# Patient Record
Sex: Male | Born: 1990 | Race: White | Hispanic: No | Marital: Single | Smoking: Former smoker
Health system: Southern US, Community
[De-identification: ages and names within clinical notes are randomized; demographics above are authoritative.]

## PROBLEM LIST (undated history)

## (undated) DIAGNOSIS — S92919A Unspecified fracture of unspecified toe(s), initial encounter for closed fracture: Secondary | ICD-10-CM

## (undated) DIAGNOSIS — S2239XA Fracture of one rib, unspecified side, initial encounter for closed fracture: Secondary | ICD-10-CM

## (undated) DIAGNOSIS — S27309A Unspecified injury of lung, unspecified, initial encounter: Secondary | ICD-10-CM

## (undated) HISTORY — DX: Unspecified injury of lung, unspecified, initial encounter: S27.309A

## (undated) HISTORY — DX: Unspecified fracture of unspecified toe(s), initial encounter for closed fracture: S92.919A

## (undated) HISTORY — DX: Fracture of one rib, unspecified side, initial encounter for closed fracture: S22.39XA

## (undated) HISTORY — PX: SHOULDER ARTHROSCOPY: SHX128

## (undated) HISTORY — PX: HAND SURGERY: SHX662

---
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
## 2010-05-18 ENCOUNTER — Inpatient Hospital Stay (HOSPITAL_COMMUNITY)
Admission: EM | Admit: 2010-05-18 | Discharge: 2010-05-20 | DRG: 200 | Disposition: A | Discharge status: Home / Self Care | Payer: No Typology Code available for payment source | Attending: General Surgery | Admitting: General Surgery

## 2010-05-18 ENCOUNTER — Emergency Department (HOSPITAL_COMMUNITY): Payer: No Typology Code available for payment source

## 2010-05-18 ENCOUNTER — Encounter (HOSPITAL_COMMUNITY): Payer: Self-pay | Admitting: Radiology

## 2010-05-18 DIAGNOSIS — F17200 Nicotine dependence, unspecified, uncomplicated: Secondary | ICD-10-CM | POA: Diagnosis present

## 2010-05-18 DIAGNOSIS — S060X0A Concussion without loss of consciousness, initial encounter: Secondary | ICD-10-CM | POA: Diagnosis present

## 2010-05-18 DIAGNOSIS — S2239XA Fracture of one rib, unspecified side, initial encounter for closed fracture: Secondary | ICD-10-CM | POA: Diagnosis present

## 2010-05-18 DIAGNOSIS — M549 Dorsalgia, unspecified: Secondary | ICD-10-CM | POA: Diagnosis present

## 2010-05-18 DIAGNOSIS — J9383 Other pneumothorax: Principal | ICD-10-CM | POA: Diagnosis present

## 2010-05-18 DIAGNOSIS — S27329A Contusion of lung, unspecified, initial encounter: Secondary | ICD-10-CM | POA: Diagnosis present

## 2010-05-18 DIAGNOSIS — T148XXA Other injury of unspecified body region, initial encounter: Secondary | ICD-10-CM | POA: Diagnosis present

## 2010-05-18 DIAGNOSIS — Y998 Other external cause status: Secondary | ICD-10-CM

## 2010-05-18 DIAGNOSIS — S93336A Other dislocation of unspecified foot, initial encounter: Secondary | ICD-10-CM | POA: Diagnosis present

## 2010-05-18 LAB — DIFFERENTIAL
Basophils Absolute: 0 10*3/uL (ref 0.0–0.1)
Eosinophils Relative: 1 % (ref 0–5)
Lymphocytes Relative: 18 % (ref 12–46)
Lymphs Abs: 2.8 10*3/uL (ref 0.7–4.0)
Neutro Abs: 11.9 10*3/uL — ABNORMAL HIGH (ref 1.7–7.7)
Neutrophils Relative %: 75 % (ref 43–77)

## 2010-05-18 LAB — BASIC METABOLIC PANEL
Calcium: 9 mg/dL (ref 8.4–10.5)
Chloride: 105 meq/L (ref 96–112)
Creatinine, Ser: 1.13 mg/dL (ref 0.4–1.5)
GFR calc Af Amer: >60 mL/min (ref >60)
Sodium: 137 meq/L (ref 135–145)

## 2010-05-18 LAB — CBC
HCT: 46.7 % (ref 39.0–52.0)
MCV: 90.3 fL (ref 78.0–100.0)
RBC: 5.17 MIL/uL (ref 4.22–5.81)
RDW: 12.3 % (ref 11.5–15.5)
WBC: 15.8 10*3/uL — ABNORMAL HIGH (ref 4.0–10.5)

## 2010-05-18 LAB — URINALYSIS, ROUTINE W REFLEX MICROSCOPIC
Protein, ur: NEGATIVE mg/dL
Specific Gravity, Urine: 1.035 — ABNORMAL HIGH (ref 1.005–1.030)
Urobilinogen, UA: 0.2 mg/dL (ref 0.0–1.0)

## 2010-05-18 LAB — ETHANOL: Alcohol, Ethyl (B): <5 mg/dL (ref 0–10)

## 2010-05-18 MED ORDER — IOHEXOL 300 MG/ML  SOLN
80.0000 mL | Freq: Once | INTRAMUSCULAR | Status: AC | PRN
  Administered 2010-05-18: 80 mL via INTRAVENOUS

## 2010-05-19 ENCOUNTER — Inpatient Hospital Stay (HOSPITAL_COMMUNITY): Payer: No Typology Code available for payment source

## 2010-05-19 LAB — BASIC METABOLIC PANEL
CO2: 27 mEq/L (ref 19–32)
Calcium: 8.3 mg/dL — ABNORMAL LOW (ref 8.4–10.5)
Calcium: 8.6 mg/dL (ref 8.4–10.5)
Chloride: 108 mEq/L (ref 96–112)
GFR calc Af Amer: >60 mL/min (ref >60)
GFR calc Af Amer: >60 mL/min (ref >60)
GFR calc non Af Amer: >60 mL/min (ref >60)
Potassium: 4.5 mEq/L (ref 3.5–5.1)
Sodium: 139 mEq/L (ref 135–145)
Sodium: 140 mEq/L (ref 135–145)

## 2010-05-19 LAB — CBC
Hemoglobin: 13.6 g/dL (ref 13.0–17.0)
MCHC: 34.3 g/dL (ref 30.0–36.0)
MCHC: 33.7 g/dL (ref 30.0–36.0)
Platelets: 155 10*3/uL (ref 150–400)
RBC: 4.34 MIL/uL (ref 4.22–5.81)
RDW: 12.5 % (ref 11.5–15.5)
WBC: 13.4 10*3/uL — ABNORMAL HIGH (ref 4.0–10.5)
WBC: 8.6 10*3/uL (ref 4.0–10.5)

## 2010-05-19 LAB — MRSA PCR SCREENING: MRSA by PCR: NEGATIVE

## 2010-05-20 ENCOUNTER — Inpatient Hospital Stay (HOSPITAL_COMMUNITY): Payer: No Typology Code available for payment source

## 2010-05-20 LAB — CBC
HCT: 38.7 % — ABNORMAL LOW (ref 39.0–52.0)
Hemoglobin: 12.9 g/dL — ABNORMAL LOW (ref 13.0–17.0)
MCH: 31 pg (ref 26.0–34.0)
RBC: 4.16 MIL/uL — ABNORMAL LOW (ref 4.22–5.81)
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
## 2010-05-25 ENCOUNTER — Other Ambulatory Visit: Payer: Self-pay | Admitting: General Surgery

## 2010-05-25 ENCOUNTER — Ambulatory Visit (HOSPITAL_COMMUNITY)
Admission: RE | Admit: 2010-05-25 | Discharge: 2010-05-25 | Disposition: A | Discharge status: Home / Self Care | Payer: No Typology Code available for payment source | Source: Ambulatory Visit | Attending: General Surgery | Admitting: General Surgery

## 2010-05-25 DIAGNOSIS — S270XXA Traumatic pneumothorax, initial encounter: Secondary | ICD-10-CM | POA: Insufficient documentation

## 2010-05-25 DIAGNOSIS — J939 Pneumothorax, unspecified: Secondary | ICD-10-CM

## 2010-05-25 DIAGNOSIS — R0789 Other chest pain: Secondary | ICD-10-CM | POA: Insufficient documentation
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
## 2010-07-25 ENCOUNTER — Encounter (INDEPENDENT_AMBULATORY_CARE_PROVIDER_SITE_OTHER): Payer: Self-pay | Admitting: Surgery

## 2013-03-07 ENCOUNTER — Emergency Department (INDEPENDENT_AMBULATORY_CARE_PROVIDER_SITE_OTHER)
Admission: EM | Admit: 2013-03-07 | Discharge: 2013-03-07 | Disposition: A | Discharge status: Home / Self Care | Payer: Worker's Compensation | Source: Home / Self Care

## 2013-03-07 ENCOUNTER — Encounter (HOSPITAL_COMMUNITY): Payer: Self-pay | Admitting: Emergency Medicine

## 2013-03-07 ENCOUNTER — Emergency Department (INDEPENDENT_AMBULATORY_CARE_PROVIDER_SITE_OTHER): Payer: Worker's Compensation

## 2013-03-07 DIAGNOSIS — S91109A Unspecified open wound of unspecified toe(s) without damage to nail, initial encounter: Secondary | ICD-10-CM

## 2013-03-07 DIAGNOSIS — Z23 Encounter for immunization: Secondary | ICD-10-CM

## 2013-03-07 DIAGNOSIS — S99929A Unspecified injury of unspecified foot, initial encounter: Secondary | ICD-10-CM

## 2013-03-07 MED ORDER — NAPROXEN 375 MG PO TABS: 375.0000 mg | ORAL_TABLET | Freq: Two times a day (BID) | ORAL | Status: AC

## 2013-03-07 MED ORDER — TRAMADOL HCL 50 MG PO TABS
50.0000 mg | ORAL_TABLET | Freq: Four times a day (QID) | ORAL | Status: AC | PRN
Start: 2013-03-07 — End: ?

## 2013-03-07 MED ORDER — TETANUS-DIPHTH-ACELL PERTUSSIS 5-2.5-18.5 LF-MCG/0.5 IM SUSP
INTRAMUSCULAR | Status: AC
  Filled 2013-03-07: qty 0.5

## 2013-03-07 MED ORDER — TETANUS-DIPHTH-ACELL PERTUSSIS 5-2.5-18.5 LF-MCG/0.5 IM SUSP
0.5000 mL | Freq: Once | INTRAMUSCULAR | Status: AC
  Administered 2013-03-07: 0.5 mL via INTRAMUSCULAR
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
## 2013-03-07 NOTE — ED Notes (Signed)
Reported crush injury to right great toe at work PTA

## 2013-03-07 NOTE — ED Provider Notes (Signed)
Medical screening examination/treatment/procedure(s) were performed by non-physician practitioner and as supervising physician I was immediately available for consultation/collaboration.  Sadye Kiernan, M.D.  Siobhan Zaro C Emre Stock, MD 03/07/13 2224 
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
## 2013-03-07 NOTE — Discharge Instructions (Signed)
RICE: Routine Care for Injuries The routine care of many injuries includes Rest, Ice, Compression, and Elevation (RICE). HOME CARE INSTRUCTIONS  Rest is needed to allow your body to heal. Routine activities can usually be resumed when comfortable. Injured tendons and bones can take up to 6 weeks to heal. Tendons are the cord-like structures that attach muscle to bone.  Ice following an injury helps keep the swelling down and reduces pain.  Put ice in a plastic bag.  Place a towel between your skin and the bag.  Leave the ice on for 15-20 minutes, 03-04 times a day. Do this while awake, for the first 24 to 48 hours. After that, continue as directed by your caregiver.  Compression helps keep swelling down. It also gives support and helps with discomfort. If an elastic bandage has been applied, it should be removed and reapplied every 3 to 4 hours. It should not be applied tightly, but firmly enough to keep swelling down. Watch fingers or toes for swelling, bluish discoloration, coldness, numbness, or excessive pain. If any of these problems occur, remove the bandage and reapply loosely. Contact your caregiver if these problems continue.  Elevation helps reduce swelling and decreases pain. With extremities, such as the arms, hands, legs, and feet, the injured area should be placed near or above the level of the heart, if possible. SEEK IMMEDIATE MEDICAL CARE IF:  You have persistent pain and swelling.  You develop redness, numbness, or unexpected weakness.  Your symptoms are getting worse rather than improving after several days. These symptoms may indicate that further evaluation or further X-rays are needed. Sometimes, X-rays may not show a small broken bone (fracture) until 1 week or 10 days later. Make a follow-up appointment with your caregiver. Ask when your X-ray results will be ready. Make sure you get your X-ray results. Document Released: 05/20/2000 Document Revised: 04/30/2011  Document Reviewed: 07/07/2010 [employer] Patient Information 2014 [employer].  

## 2014-09-09 ENCOUNTER — Emergency Department (HOSPITAL_COMMUNITY): Payer: No Typology Code available for payment source

## 2014-09-09 ENCOUNTER — Encounter (HOSPITAL_COMMUNITY): Payer: Self-pay | Admitting: Radiology

## 2014-09-09 ENCOUNTER — Inpatient Hospital Stay (HOSPITAL_COMMUNITY)
Admission: EM | Admit: 2014-09-09 | Discharge: 2014-09-12 | DRG: 816 | Disposition: A | Discharge status: Home / Self Care | Payer: No Typology Code available for payment source | Attending: General Surgery | Admitting: General Surgery

## 2014-09-09 DIAGNOSIS — Z79899 Other long term (current) drug therapy: Secondary | ICD-10-CM

## 2014-09-09 DIAGNOSIS — F17210 Nicotine dependence, cigarettes, uncomplicated: Secondary | ICD-10-CM | POA: Diagnosis present

## 2014-09-09 DIAGNOSIS — Z791 Long term (current) use of non-steroidal anti-inflammatories (NSAID): Secondary | ICD-10-CM | POA: Diagnosis not present

## 2014-09-09 DIAGNOSIS — S36039A Unspecified laceration of spleen, initial encounter: Secondary | ICD-10-CM | POA: Diagnosis present

## 2014-09-09 DIAGNOSIS — S0031XA Abrasion of nose, initial encounter: Secondary | ICD-10-CM | POA: Diagnosis present

## 2014-09-09 DIAGNOSIS — R1032 Left lower quadrant pain: Secondary | ICD-10-CM | POA: Diagnosis present

## 2014-09-09 LAB — URINALYSIS, ROUTINE W REFLEX MICROSCOPIC
Bilirubin Urine: NEGATIVE
GLUCOSE, UA: NEGATIVE mg/dL
Hgb urine dipstick: NEGATIVE
Ketones, ur: NEGATIVE mg/dL
Leukocytes, UA: NEGATIVE
NITRITE: NEGATIVE
PH: 5.5 (ref 5.0–8.0)
Protein, ur: NEGATIVE mg/dL
Specific Gravity, Urine: 1.006 (ref 1.005–1.030)
UROBILINOGEN UA: 0.2 mg/dL (ref 0.0–1.0)

## 2014-09-09 LAB — CBC WITH DIFFERENTIAL/PLATELET
BASOS ABS: 0 10*3/uL (ref 0.0–0.1)
BASOS PCT: 0 % (ref 0–1)
Eosinophils Absolute: 0 10*3/uL (ref 0.0–0.7)
Eosinophils Relative: 0 % (ref 0–5)
HEMATOCRIT: 42.5 % (ref 39.0–52.0)
Hemoglobin: 15.5 g/dL (ref 13.0–17.0)
Lymphocytes Relative: 11 % — ABNORMAL LOW (ref 12–46)
Lymphs Abs: 1.2 10*3/uL (ref 0.7–4.0)
MCH: 32.2 pg (ref 26.0–34.0)
MCHC: 36.5 g/dL — AB (ref 30.0–36.0)
MCV: 88.2 fL (ref 78.0–100.0)
MONO ABS: 0.9 10*3/uL (ref 0.1–1.0)
MONOS PCT: 8 % (ref 3–12)
NEUTROS ABS: 9.3 10*3/uL — AB (ref 1.7–7.7)
Neutrophils Relative %: 81 % — ABNORMAL HIGH (ref 43–77)
Platelets: 175 10*3/uL (ref 150–400)
RBC: 4.82 MIL/uL (ref 4.22–5.81)
RDW: 12.1 % (ref 11.5–15.5)
WBC: 11.5 10*3/uL — AB (ref 4.0–10.5)

## 2014-09-09 LAB — COMPREHENSIVE METABOLIC PANEL
ALBUMIN: 3.7 g/dL (ref 3.5–5.0)
ALK PHOS: 57 U/L (ref 38–126)
ALT: 32 U/L (ref 17–63)
AST: 32 U/L (ref 15–41)
Anion gap: 9 (ref 5–15)
BUN: 14 mg/dL (ref 6–20)
CALCIUM: 8.3 mg/dL — AB (ref 8.9–10.3)
CO2: 23 mmol/L (ref 22–32)
CREATININE: 1.12 mg/dL (ref 0.61–1.24)
Chloride: 101 mmol/L (ref 101–111)
GFR calc Af Amer: >60 mL/min (ref >60)
Glucose, Bld: 131 mg/dL — ABNORMAL HIGH (ref 65–99)
Potassium: 3.9 mmol/L (ref 3.5–5.1)
Sodium: 133 mmol/L — ABNORMAL LOW (ref 135–145)
Total Bilirubin: 0.8 mg/dL (ref 0.3–1.2)
Total Protein: 5.8 g/dL — ABNORMAL LOW (ref 6.5–8.1)

## 2014-09-09 LAB — CBC
HCT: 39.2 % (ref 39.0–52.0)
HEMATOCRIT: 41.2 % (ref 39.0–52.0)
HEMOGLOBIN: 13.9 g/dL (ref 13.0–17.0)
HEMOGLOBIN: 14.3 g/dL (ref 13.0–17.0)
MCH: 31.4 pg (ref 26.0–34.0)
MCH: 31.4 pg (ref 26.0–34.0)
MCHC: 35.5 g/dL (ref 30.0–36.0)
MCHC: 34.7 g/dL (ref 30.0–36.0)
MCV: 88.7 fL (ref 78.0–100.0)
MCV: 90.4 fL (ref 78.0–100.0)
Platelets: 153 10*3/uL (ref 150–400)
Platelets: 152 10*3/uL (ref 150–400)
RBC: 4.42 MIL/uL (ref 4.22–5.81)
RBC: 4.56 MIL/uL (ref 4.22–5.81)
RDW: 12.3 % (ref 11.5–15.5)
RDW: 12.7 % (ref 11.5–15.5)
WBC: 9.4 10*3/uL (ref 4.0–10.5)
WBC: 8.1 10*3/uL (ref 4.0–10.5)

## 2014-09-09 LAB — BASIC METABOLIC PANEL
Anion gap: 8 (ref 5–15)
BUN: 11 mg/dL (ref 6–20)
CALCIUM: 7.7 mg/dL — AB (ref 8.9–10.3)
CHLORIDE: 108 mmol/L (ref 101–111)
CO2: 21 mmol/L — AB (ref 22–32)
CREATININE: 0.89 mg/dL (ref 0.61–1.24)
GFR calc Af Amer: >60 mL/min (ref >60)
GLUCOSE: 93 mg/dL (ref 65–99)
Potassium: 3.7 mmol/L (ref 3.5–5.1)
SODIUM: 137 mmol/L (ref 135–145)

## 2014-09-09 LAB — SAMPLE TO BLOOD BANK

## 2014-09-09 LAB — MRSA PCR SCREENING: MRSA by PCR: NEGATIVE

## 2014-09-09 MED ORDER — IOHEXOL 300 MG/ML  SOLN
100.0000 mL | Freq: Once | INTRAMUSCULAR | Status: AC | PRN
  Administered 2014-09-09: 100 mL via INTRAVENOUS

## 2014-09-09 MED ORDER — OXYCODONE HCL 5 MG PO TABS
5.0000 mg | ORAL_TABLET | ORAL | Status: DC | PRN
  Administered 2014-09-09: 5 mg via ORAL
  Filled 2014-09-09: qty 1

## 2014-09-09 MED ORDER — HYDROMORPHONE HCL 1 MG/ML IJ SOLN
1.0000 mg | Freq: Once | INTRAMUSCULAR | Status: AC
Start: 2014-09-09 — End: 2014-09-09
  Administered 2014-09-09: 1 mg via INTRAVENOUS
  Filled 2014-09-09: qty 1

## 2014-09-09 MED ORDER — MORPHINE SULFATE 4 MG/ML IJ SOLN
4.0000 mg | Freq: Once | INTRAMUSCULAR | Status: AC
  Administered 2014-09-09: 4 mg via INTRAVENOUS
  Filled 2014-09-09: qty 1

## 2014-09-09 MED ORDER — ACETAMINOPHEN 325 MG PO TABS: 650.0000 mg | ORAL_TABLET | ORAL | Status: DC | PRN

## 2014-09-09 MED ORDER — ONDANSETRON HCL 4 MG/2ML IJ SOLN: 4.0000 mg | Freq: Four times a day (QID) | INTRAMUSCULAR | Status: DC | PRN

## 2014-09-09 MED ORDER — ONDANSETRON HCL 4 MG/2ML IJ SOLN
4.0000 mg | Freq: Once | INTRAMUSCULAR | Status: AC
  Administered 2014-09-09: 4 mg via INTRAVENOUS
  Filled 2014-09-09: qty 2

## 2014-09-09 MED ORDER — SODIUM CHLORIDE 0.9 % IV BOLUS (SEPSIS)
1000.0000 mL | Freq: Once | INTRAVENOUS | Status: AC
  Administered 2014-09-09: 1000 mL via INTRAVENOUS

## 2014-09-09 MED ORDER — MORPHINE SULFATE 2 MG/ML IJ SOLN
2.0000 mg | INTRAMUSCULAR | Status: DC | PRN
  Administered 2014-09-09 – 2014-09-12 (×8): 2 mg via INTRAVENOUS
  Filled 2014-09-09 (×8): qty 1

## 2014-09-09 MED ORDER — ONDANSETRON HCL 4 MG PO TABS: 4.0000 mg | ORAL_TABLET | Freq: Four times a day (QID) | ORAL | Status: DC | PRN

## 2014-09-09 MED ORDER — SODIUM CHLORIDE 0.9 % IV SOLN
INTRAVENOUS | Status: DC
  Administered 2014-09-09: 05:00:00 via INTRAVENOUS

## 2014-09-09 MED ORDER — SODIUM CHLORIDE 0.9 % IV SOLN
INTRAVENOUS | Status: DC
  Administered 2014-09-09 – 2014-09-10 (×2): via INTRAVENOUS

## 2014-09-09 MED ORDER — OXYCODONE HCL 5 MG PO TABS
10.0000 mg | ORAL_TABLET | ORAL | Status: DC | PRN
  Administered 2014-09-09: 5 mg via ORAL
  Administered 2014-09-09 – 2014-09-11 (×6): 10 mg via ORAL
  Filled 2014-09-09 (×7): qty 2

## 2014-09-09 MED ORDER — TETANUS-DIPHTH-ACELL PERTUSSIS 5-2.5-18.5 LF-MCG/0.5 IM SUSP
0.5000 mL | Freq: Once | INTRAMUSCULAR | Status: AC
  Administered 2014-09-09: 0.5 mL via INTRAMUSCULAR
  Filled 2014-09-09: qty 0.5
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
## 2014-09-09 NOTE — Progress Notes (Signed)
Dr. Lindie Spruce aware of hbg dope in trend with follow up lab results. Darrel Hoover 11:37 AM

## 2014-09-09 NOTE — ED Notes (Signed)
Returned from CT.

## 2014-09-09 NOTE — Progress Notes (Signed)
Patient is stable with still some left sided abdominal discomfort.  No peritonitis.  Excellent bowel sounds.  Will advance diet as tolerated and decrease the frequency of blood draws.  Richard Liu. Gae Bon, MD, FACS [phone] Trauma Surgeon
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
## 2014-09-09 NOTE — ED Notes (Signed)
Patient transported to CT 

## 2014-09-10 LAB — CBC
HEMATOCRIT: 43.1 % (ref 39.0–52.0)
HEMOGLOBIN: 15.5 g/dL (ref 13.0–17.0)
MCH: 32.6 pg (ref 26.0–34.0)
MCHC: 36 g/dL (ref 30.0–36.0)
MCV: 90.7 fL (ref 78.0–100.0)
Platelets: 137 10*3/uL — ABNORMAL LOW (ref 150–400)
RBC: 4.75 MIL/uL (ref 4.22–5.81)
RDW: 12.5 % (ref 11.5–15.5)
WBC: 11.4 10*3/uL — ABNORMAL HIGH (ref 4.0–10.5)
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
## 2014-09-10 NOTE — Progress Notes (Signed)
Report given to [name]. Pt transferred to 6N-24 via wheelchair, pt ambulated in room. Belongings at bedside, mother notified. VS stable at time of transfer. No questions or concerns at this time. [name] L

## 2014-09-11 LAB — CBC
HEMATOCRIT: 43.9 % (ref 39.0–52.0)
HEMOGLOBIN: 15.4 g/dL (ref 13.0–17.0)
MCH: 31.7 pg (ref 26.0–34.0)
MCHC: 35.1 g/dL (ref 30.0–36.0)
MCV: 90.3 fL (ref 78.0–100.0)
Platelets: 140 10*3/uL — ABNORMAL LOW (ref 150–400)
RBC: 4.86 MIL/uL (ref 4.22–5.81)
RDW: 12.2 % (ref 11.5–15.5)
WBC: 6.6 10*3/uL (ref 4.0–10.5)

## 2014-09-11 LAB — BASIC METABOLIC PANEL
Anion gap: 8 (ref 5–15)
BUN: 12 mg/dL (ref 6–20)
CHLORIDE: 105 mmol/L (ref 101–111)
CO2: 26 mmol/L (ref 22–32)
Calcium: 8.6 mg/dL — ABNORMAL LOW (ref 8.9–10.3)
Creatinine, Ser: 1.09 mg/dL (ref 0.61–1.24)
GFR calc Af Amer: >60 mL/min (ref >60)
GFR calc non Af Amer: >60 mL/min (ref >60)
GLUCOSE: 105 mg/dL — AB (ref 65–99)
POTASSIUM: 3.8 mmol/L (ref 3.5–5.1)
Sodium: 139 mmol/L (ref 135–145)
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
## 2014-09-12 LAB — CBC
HCT: 46.9 % (ref 39.0–52.0)
Hemoglobin: 16.8 g/dL (ref 13.0–17.0)
MCH: 32.1 pg (ref 26.0–34.0)
MCHC: 35.8 g/dL (ref 30.0–36.0)
MCV: 89.7 fL (ref 78.0–100.0)
Platelets: 141 10*3/uL — ABNORMAL LOW (ref 150–400)
RBC: 5.23 MIL/uL (ref 4.22–5.81)
RDW: 12.2 % (ref 11.5–15.5)
WBC: 9.9 10*3/uL (ref 4.0–10.5)

## 2014-09-12 MED ORDER — OXYCODONE HCL 5 MG PO TABS: 5.0000 mg | ORAL_TABLET | ORAL | Status: AC | PRN
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
## 2014-09-12 NOTE — Progress Notes (Signed)
Pt discharged to home.  Discharge instructions explained to pt.  Pt has no questions at the time of discharge.  Pt states he has all belongings.  IV dc'd.  Pt ambulated out of hospital on his own.

## 2014-09-12 NOTE — Discharge Instructions (Signed)
Splenic Injury A splenic injury is an injury to the spleen. The spleen is an organ located in the upper left side of the abdomen, just under the ribs. The spleen filters and cleans the blood. It also stores blood cells and destroys cells that are worn out. The spleen is involved in fighting disease. However, when the spleen is removed, your body can still fight most diseases without it.  CAUSES  A blow to the abdomen can injure the spleen. In some cases, the spleen may only be bruised with some bleeding inside the covering and around the spleen. However, sometimes an injury to the spleen causes it to break (rupture). Illness can also cause the spleen to become enlarged and rupture. Because the spleen has a lot of blood going to it, a rupture is very serious and can be life-threatening. SYMPTOMS  Often, a minor splenic injury causes no symptoms or only minor abdominal pain. When bleeding from the injury is severe, the patient's blood pressure may rapidly decrease. This may cause some of the following problems:  Dizziness or lightheadedness.  Rapid heart rate.  Abdominal tenderness and swelling.  Fainting.  Sweating with clammy skin. DIAGNOSIS  Your caregiver may immediately know what is wrong by taking a history and doing a physical exam. If there is time, the diagnosis is often confirmed by a CT scan. Other imaging tests may also be done, such as an ultrasound exam or sometimes an MRI scan. Lab tests may be done to check your blood and may be needed frequently for a couple days after the injury.  TREATMENT   If the blood pressure is too low, emergency surgery may be needed.  When injuries are less severe, your surgeon may decide to observe the injury, or to treat the injury with interventional radiology.Interventional radiology uses flexible tubes (catheters) to stop the bleeding from inside the blood vessel. It can only be used under certain circumstances. Your caregiver will tell you if this  is an option.  One to several days in an intensive care unit (ICU) may be required. Fluid and blood levels will be monitored closely.Intravenous (IV) fluids and blood transfusions are sometimes needed. Follow-up scans may be used to check how the spleen is healing. The spleen may be able to heal itself. However, if the problems are getting worse, surgery may still be needed. HOME CARE INSTRUCTIONS  Keep all follow-up appointments as instructed by your caregiver. Even when the spleen appears to have healed well, your surgeon may want to continue following up on the injury.  Ask your caregiver if you need any immunizations. You may need certain immunizations depending on whether you had surgery, interventional radiology, or some other treatment for your spleen injury. SEEK IMMEDIATE MEDICAL CARE IF:  You have a fever.  Your abdominal pain gets worse.  You develop signs of infection, such as chills and feeling unwell. MAKE SURE YOU:  Understand these instructions.  Will watch your condition.  Will get help right away if you are not doing well or get worse. Document Released: 11/27/2005 Document Revised: 04/30/2011 Document Reviewed: 08/11/2010 [facility] Patient Information 2015 [place], [place]. This information is not intended to replace advice given to you by your health care provider. Make sure you discuss any questions you have with your health care provider.  Splenic Injury A splenic injury is an injury to the spleen. The spleen is an organ located in the upper left side of the abdomen, just under the ribs. The spleen filters and  cleans the blood. It also stores blood cells and destroys cells that are worn out. The spleen is involved in fighting disease. However, when the spleen is removed, your body can still fight most diseases without it.  CAUSES  A blow to the abdomen can injure the spleen. In some cases, the spleen may only be bruised with some bleeding inside the covering and  around the spleen. However, sometimes an injury to the spleen causes it to break (rupture). Illness can also cause the spleen to become enlarged and rupture. Because the spleen has a lot of blood going to it, a rupture is very serious and can be life-threatening. SYMPTOMS  Often, a minor splenic injury causes no symptoms or only minor abdominal pain. When bleeding from the injury is severe, the patient's blood pressure may rapidly decrease. This may cause some of the following problems:  Dizziness or lightheadedness.  Rapid heart rate.  Abdominal tenderness and swelling.  Fainting.  Sweating with clammy skin. DIAGNOSIS  Your caregiver may immediately know what is wrong by taking a history and doing a physical exam. If there is time, the diagnosis is often confirmed by a CT scan. Other imaging tests may also be done, such as an ultrasound exam or sometimes an MRI scan. Lab tests may be done to check your blood and may be needed frequently for a couple days after the injury.  TREATMENT   If the blood pressure is too low, emergency surgery may be needed.  When injuries are less severe, your surgeon may decide to observe the injury, or to treat the injury with interventional radiology.Interventional radiology uses flexible tubes (catheters) to stop the bleeding from inside the blood vessel. It can only be used under certain circumstances. Your caregiver will tell you if this is an option.  One to several days in an intensive care unit (ICU) may be required. Fluid and blood levels will be monitored closely.Intravenous (IV) fluids and blood transfusions are sometimes needed. Follow-up scans may be used to check how the spleen is healing. The spleen may be able to heal itself. However, if the problems are getting worse, surgery may still be needed. HOME CARE INSTRUCTIONS  Keep all follow-up appointments as instructed by your caregiver. Even when the spleen appears to have healed well, your  surgeon may want to continue following up on the injury.  Ask your caregiver if you need any immunizations. You may need certain immunizations depending on whether you had surgery, interventional radiology, or some other treatment for your spleen injury. SEEK IMMEDIATE MEDICAL CARE IF:  You have a fever.  Your abdominal pain gets worse.  You develop signs of infection, such as chills and feeling unwell. MAKE SURE YOU:  Understand these instructions.  Will watch your condition.  Will get help right away if you are not doing well or get worse. Document Released: 11/27/2005 Document Revised: 04/30/2011 Document Reviewed: 08/11/2010 [facility] Patient Information 2015 [place], [place]. This information is not intended to replace advice given to you by your health care provider. Make sure you discuss any questions you have with your health care provider.
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
